# Patient Record
Sex: Female | Born: 2000 | Race: White | Hispanic: No | Marital: Single | Smoking: Never smoker
Health system: Southern US, Community
[De-identification: ages and names within clinical notes are randomized; demographics above are authoritative.]

## PROBLEM LIST (undated history)

## (undated) DIAGNOSIS — F32A Depression, unspecified: Secondary | ICD-10-CM

## (undated) DIAGNOSIS — F329 Major depressive disorder, single episode, unspecified: Secondary | ICD-10-CM

## (undated) DIAGNOSIS — J45909 Unspecified asthma, uncomplicated: Secondary | ICD-10-CM

## (undated) DIAGNOSIS — Q332 Sequestration of lung: Secondary | ICD-10-CM

## (undated) DIAGNOSIS — Q330 Congenital cystic lung: Secondary | ICD-10-CM

## (undated) DIAGNOSIS — F419 Anxiety disorder, unspecified: Secondary | ICD-10-CM

## (undated) DIAGNOSIS — R519 Headache, unspecified: Secondary | ICD-10-CM

## (undated) DIAGNOSIS — R0600 Dyspnea, unspecified: Secondary | ICD-10-CM

---
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
## 2020-04-23 ENCOUNTER — Emergency Department (HOSPITAL_COMMUNITY)
Admission: EM | Admit: 2020-04-23 | Discharge: 2020-04-23 | Disposition: A | Discharge status: Home / Self Care | Payer: Medicaid - Out of State | Attending: Emergency Medicine | Admitting: Emergency Medicine

## 2020-04-23 ENCOUNTER — Encounter (HOSPITAL_COMMUNITY): Payer: Self-pay

## 2020-04-23 ENCOUNTER — Other Ambulatory Visit: Payer: Self-pay

## 2020-04-23 DIAGNOSIS — Y92410 Unspecified street and highway as the place of occurrence of the external cause: Secondary | ICD-10-CM | POA: Insufficient documentation

## 2020-04-23 DIAGNOSIS — M25561 Pain in right knee: Secondary | ICD-10-CM | POA: Insufficient documentation

## 2020-04-23 DIAGNOSIS — J45909 Unspecified asthma, uncomplicated: Secondary | ICD-10-CM | POA: Diagnosis not present

## 2020-04-23 DIAGNOSIS — S0990XA Unspecified injury of head, initial encounter: Secondary | ICD-10-CM

## 2020-04-23 DIAGNOSIS — M25512 Pain in left shoulder: Secondary | ICD-10-CM | POA: Insufficient documentation

## 2020-04-23 DIAGNOSIS — S0083XA Contusion of other part of head, initial encounter: Secondary | ICD-10-CM | POA: Diagnosis not present

## 2020-04-23 DIAGNOSIS — M7918 Myalgia, other site: Secondary | ICD-10-CM

## 2020-04-23 DIAGNOSIS — M545 Low back pain, unspecified: Secondary | ICD-10-CM | POA: Insufficient documentation

## 2020-04-23 MED ORDER — METHOCARBAMOL 500 MG PO TABS
1000.0000 mg | ORAL_TABLET | Freq: Four times a day (QID) | ORAL | 0 refills | Status: AC
Start: 2020-04-23 — End: ?

## 2020-04-23 MED ORDER — METHOCARBAMOL 500 MG PO TABS
1000.0000 mg | ORAL_TABLET | Freq: Four times a day (QID) | ORAL | 0 refills | Status: DC
Start: 2020-04-23 — End: 2020-04-23
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
## 2020-04-23 NOTE — ED Notes (Signed)
Patient dc paperwork reviewed with the patient. The patient verbalized understanding of instructions. Patient discharged.
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
## 2020-06-30 ENCOUNTER — Emergency Department (HOSPITAL_COMMUNITY): Payer: Medicaid - Out of State

## 2020-06-30 ENCOUNTER — Other Ambulatory Visit: Payer: Self-pay

## 2020-06-30 ENCOUNTER — Encounter (HOSPITAL_COMMUNITY): Payer: Self-pay | Admitting: Emergency Medicine

## 2020-06-30 ENCOUNTER — Emergency Department (HOSPITAL_COMMUNITY)
Admission: EM | Admit: 2020-06-30 | Discharge: 2020-06-30 | Disposition: A | Discharge status: Home / Self Care | Payer: Medicaid - Out of State | Attending: Emergency Medicine | Admitting: Emergency Medicine

## 2020-06-30 DIAGNOSIS — J45909 Unspecified asthma, uncomplicated: Secondary | ICD-10-CM | POA: Insufficient documentation

## 2020-06-30 DIAGNOSIS — G43909 Migraine, unspecified, not intractable, without status migrainosus: Secondary | ICD-10-CM | POA: Diagnosis not present

## 2020-06-30 DIAGNOSIS — E236 Other disorders of pituitary gland: Secondary | ICD-10-CM

## 2020-06-30 DIAGNOSIS — H538 Other visual disturbances: Secondary | ICD-10-CM | POA: Diagnosis present

## 2020-06-30 LAB — PREGNANCY, URINE: Preg Test, Ur: NEGATIVE

## 2020-06-30 LAB — D-DIMER, QUANTITATIVE: D-Dimer, Quant: 0.31 ug/mL-FEU (ref 0.00–0.50)

## 2020-06-30 MED ORDER — OXYCODONE HCL 5 MG PO TABS: 2.5000 mg | ORAL_TABLET | ORAL | 0 refills | Status: AC | PRN

## 2020-06-30 MED ORDER — DIPHENHYDRAMINE HCL 50 MG/ML IJ SOLN
25.0000 mg | Freq: Once | INTRAMUSCULAR | Status: AC
  Administered 2020-06-30: 25 mg via INTRAVENOUS
  Filled 2020-06-30: qty 1

## 2020-06-30 MED ORDER — METOCLOPRAMIDE HCL 5 MG/ML IJ SOLN
10.0000 mg | Freq: Once | INTRAMUSCULAR | Status: AC
  Administered 2020-06-30: 10 mg via INTRAVENOUS
  Filled 2020-06-30: qty 2

## 2020-06-30 MED ORDER — SODIUM CHLORIDE 0.9 % IV BOLUS
500.0000 mL | Freq: Once | INTRAVENOUS | Status: AC
  Administered 2020-06-30: 500 mL via INTRAVENOUS

## 2020-06-30 NOTE — ED Notes (Addendum)
DC instructions reviewed with the pt, pt verbalized understanding.  PT DC.

## 2020-06-30 NOTE — Discharge Instructions (Signed)
Contact a health care provider if: You have sudden, unusual thirst. You are urinating more often than usual. You have a headache that does not go away. You develop new changes in your vision. You have clear fluid leaking from your nose or ears. You have a sensation of fluid trickling down the back of your throat. You have a salty taste in your mouth. You have trouble concentrating. Get help right away if: Your symptoms suddenly become severe. You have a nosebleed that does not stop after a few minutes. You have a fever of over 101F (38.3C). You have a severe headache. You have a stiff neck. You are confused or not as alert as usual. You have chest pain. You have shortness of breath.

## 2020-06-30 NOTE — ED Provider Notes (Signed)
[facility] EMERGENCY DEPARTMENT Provider Note   CSN: [id number] Arrival date & time: 06/30/20  0846     History No chief complaint on file.   Jennifer Daniel is a 20 y.o. female with a past medical history of anxiety, pituitary adenoma status post craniotomy presenting to the ED with a chief complaint of headache and blurry vision.  Patient states that she has had intermittent headaches since her craniotomy approximately 1 year ago.  She underwent an MRI approximately 1 week after her surgery and her neurosurgeon told her that "there was an abnormal speck there, but he does not know if it is just scar tissue.  He told me to follow-up in 1 year for another CT."  She noticed the past month she has had intermittent blurry vision that has worsened on the left side over the past week.  Reports left-sided headache as well that is only temporarily relieved with ibuprofen.  Last dose was approximately 6 hours ago.  She does have a history of migraines as well but is unable to differentiate whether this is similar to a migraine or the symptoms she experienced prior to her surgery.  She denies any numbness in arms or legs, changes to gait, vomiting.  Reports some nausea a few days ago and lightheadedness at work.  Denies any neck stiffness or fever.  Denies possibility of pregnancy.  No chest pain, shortness of breath, cough, sinus pain and pressure. She is in the process of moving and is unsure if this could be caused by stress.  HPI     Past Medical History:  Diagnosis Date  . Anxiety   . Asthma    physically induced asthma  . Congenital cystic adenomatoid malformation (CCAM)   . Depression   . Dyspnea    when over exerting self  . Headache   . Pituitary tumor 04/2019  . Pulmonary sequestration    cyst    Patient Active Problem List   Diagnosis Date Noted  . Pituitary adenoma ([place]) 06/23/2019    Past Surgical History:  Procedure Laterality Date  . CRANIOTOMY N/A  06/23/2019   Procedure: CRANIOTOMY HYPOPHYSECTOMY TRANSNASAL APPROACH;  Surgeon: Judith Part, MD;  Location: [facility];  Service: Neurosurgery;  Laterality: N/A;  . ENDOSCOPIC TRANS NASAL APPROACH WITH FUSION N/A 06/23/2019   Procedure: ENDOSCOPIC TRANS NASAL APPROACH WITH FUSION;  Surgeon: Jerrell Belfast, MD;  Location: [facility];  Service: ENT;  Laterality: N/A;  . LOBECTOMY Left 2002   lower left lobe , due to a cyst @ [facility]  . TRANSPHENOIDAL APPROACH EXPOSURE N/A 06/23/2019   Procedure: TRANSPHENOIDAL APPROACH EXPOSURE;  Surgeon: Jerrell Belfast, MD;  Location: [facility];  Service: ENT;  Laterality: N/A;     OB History   No obstetric history on file.     No family history on file.  Social History   Tobacco Use  . Smoking status: Never Smoker  . Smokeless tobacco: Never Used  . Tobacco comment: smok  Substance Use Topics  . Alcohol use: Never  . Drug use: Never    Home Medications Prior to Admission medications   Medication Sig Start Date End Date Taking? Authorizing Provider  methocarbamol (ROBAXIN) 500 MG tablet Take 2 tablets (1,000 mg total) by mouth 4 (four) times daily. 04/23/20   Carlisle Cater, PA-C    Allergies    Patient has no known allergies.  Review of Systems   Review of Systems  Constitutional: Negative for appetite change, chills and  fever.  HENT: Negative for ear pain, rhinorrhea, sneezing and sore throat.   Eyes: Negative for photophobia and visual disturbance.  Respiratory: Negative for cough, chest tightness, shortness of breath and wheezing.   Cardiovascular: Negative for chest pain and palpitations.  Gastrointestinal: Negative for abdominal pain, blood in stool, constipation, diarrhea, nausea and vomiting.  Genitourinary: Negative for dysuria, hematuria and urgency.  Musculoskeletal: Negative for myalgias.  Skin: Negative for rash.  Neurological: Positive for headaches. Negative for dizziness, weakness and light-headedness.    Physical  Exam Updated Vital Signs BP 126/75   Pulse 62   Temp 98.1 F (36.7 C) (Oral)   Resp 14   Ht 5\' 10"  (1.778 m)   Wt 108.9 kg   SpO2 100%   BMI 34.44 kg/m   Physical Exam Vitals and nursing note reviewed.  Constitutional:      General: She is not in acute distress.    Appearance: She is well-developed.  HENT:     Head: Normocephalic and atraumatic.     Nose: Nose normal.  Eyes:     General: No scleral icterus.       Right eye: No discharge.        Left eye: No discharge.     Conjunctiva/sclera: Conjunctivae normal.     Pupils: Pupils are equal, round, and reactive to light.  Neck:     Comments: No meningeal signs. Cardiovascular:     Rate and Rhythm: Normal rate and regular rhythm.     Heart sounds: Normal heart sounds. No murmur heard. No friction rub. No gallop.   Pulmonary:     Effort: Pulmonary effort is normal. No respiratory distress.     Breath sounds: Normal breath sounds.  Abdominal:     General: Bowel sounds are normal. There is no distension.     Palpations: Abdomen is soft.     Tenderness: There is no abdominal tenderness. There is no guarding.  Musculoskeletal:        General: Normal range of motion.     Cervical back: Normal range of motion and neck supple.  Skin:    General: Skin is warm and dry.     Findings: No rash.  Neurological:     Mental Status: She is alert and oriented to person, place, and time. Mental status is at baseline.     Cranial Nerves: No cranial nerve deficit.     Sensory: No sensory deficit.     Motor: No weakness or abnormal muscle tone.     Coordination: Coordination normal.     Comments: Pupils reactive. No facial asymmetry noted. Cranial nerves appear grossly intact. Sensation intact to light touch on face, BUE and BLE. Strength 5/5 in BUE and BLE. Normal finger to nose coordination bilaterally.     ED Results / Procedures / Treatments   Labs (all labs ordered are listed, but only abnormal results are displayed) Labs  Reviewed  PREGNANCY, URINE    EKG None  Radiology No results found.  Procedures Procedures   Medications Ordered in ED Medications  metoCLOPramide (REGLAN) injection 10 mg (10 mg Intravenous Given 06/30/20 1358)  diphenhydrAMINE (BENADRYL) injection 25 mg (25 mg Intravenous Given 06/30/20 1358)  sodium chloride 0.9 % bolus 500 mL (500 mLs Intravenous New Bag/Given 06/30/20 1357)    ED Course  I have reviewed the triage vital signs and the nursing notes.  Pertinent labs & imaging results that were available during my care of the patient were reviewed by me and  considered in my medical decision making (see chart for details).    MDM Rules/Calculators/A&P                          20 year old female with a past medical history of pituitary adenoma status postsurgery 1 year ago presenting to the ED with a chief complaint of headache.  Reports history of migraines in the past even before she felt was found to have the adenoma.  She noticed over the past month that her bilateral blurry vision has worsened in the past week it has worsened on the left side.  Reports left-sided headache that has been present for the past week.  Reports nausea and lightheadedness a few days ago but none currently.  No vomiting.  No neck stiffness, numbness in arms or legs, loss of vision, chest pain, sinus pressure.  On exam patient without neurological deficits noted.  No numbness or weakness.  No abnormal coordination.  She remains ambulatory.  Vital signs within normal limits.  We will need to obtain repeat MRI to evaluate for tumor recurrence. In the meantime will attempt to treat with migraine cocktail.   Portions of this note were generated with [employer]. Dictation errors may occur despite best attempts at proofreading.  Final Clinical Impression(s) / ED Diagnoses Final diagnoses:  Migraine without status migrainosus, not intractable, unspecified migraine type    Rx / DC Orders ED  Discharge Orders    None       Delia Heady, PA-C 06/30/20 1447    Lucrezia Starch, MD 06/30/20 2059

## 2020-06-30 NOTE — ED Notes (Signed)
2 warm blankets provided  

## 2020-06-30 NOTE — Progress Notes (Signed)
Patient ID: Jennifer Daniel, female   DOB: 03/07/2000, 20 y.o.   MRN: [id number] Films reviewed, ok for outpatient follow up with Dr. Zada Finders.

## 2020-06-30 NOTE — ED Notes (Signed)
Visual Acuity performed  R eye: 20/32 L eye: 20/16

## 2020-06-30 NOTE — ED Triage Notes (Signed)
Pt here with c/o headache for 3 days , pt does have a hx of brain tumor , surgery may 2021

## 2020-06-30 NOTE — ED Notes (Signed)
Blurred vision in left eye

## 2020-06-30 NOTE — ED Notes (Signed)
Patient transported to MRI 

## 2020-06-30 NOTE — ED Provider Notes (Signed)
Patient taken in signout from [place].  This is 20 year old female with a history of pituitary adenoma she has had 1 month of blurry vision and 1 week of significant left-sided headache she does also have a history of migraines.  No obvious neurodeficits on exam.  Currently awaiting MRI.  MRI results concerning for recurrent Pituitary adenoma.  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 Case discussed with Dr. Christella Noa who reports no further work up or imaging necessary at this time. PDMP reviewed during this encounter. Oxycodone rx given to use for severe pain as headache is unrelenting and no otc meds working. Patient will follow up with Dr. Zada Finders for recurrent tumor. Discussed return precautions.   Margarita Mail, PA-C 07/01/20 5726    Quintella Reichert, MD 07/01/20 [phone]

## 2020-07-06 ENCOUNTER — Other Ambulatory Visit (HOSPITAL_COMMUNITY): Payer: Self-pay | Admitting: Neurological Surgery

## 2020-07-06 ENCOUNTER — Other Ambulatory Visit: Payer: Self-pay | Admitting: Neurological Surgery

## 2020-07-06 DIAGNOSIS — D352 Benign neoplasm of pituitary gland: Secondary | ICD-10-CM

## 2020-07-12 ENCOUNTER — Ambulatory Visit (HOSPITAL_COMMUNITY)
Admission: RE | Admit: 2020-07-12 | Discharge: 2020-07-12 | Disposition: A | Discharge status: Home / Self Care | Payer: BC Managed Care – PPO | Source: Ambulatory Visit | Attending: Neurological Surgery | Admitting: Neurological Surgery

## 2020-07-12 ENCOUNTER — Other Ambulatory Visit: Payer: Self-pay

## 2020-07-12 DIAGNOSIS — D352 Benign neoplasm of pituitary gland: Secondary | ICD-10-CM | POA: Diagnosis present

## 2020-07-12 MED ORDER — GADOBUTROL 1 MMOL/ML IV SOLN
7.0000 mL | Freq: Once | INTRAVENOUS | Status: AC | PRN
  Administered 2020-07-12: 7 mL via INTRAVENOUS

## 2022-09-10 IMAGING — MR MR HEAD W/O CM
7 of 11 series · 25 of 48 positions shown · non-contrast
Comparison: MR head without and with contrast 11/09/2019 and
05/10/2019

CLINICAL DATA: Benign neoplasm of the CNS. Pituitary tumor status
post resection.

EXAM:
MRI HEAD WITHOUT CONTRAST
TECHNIQUE: Multiplanar, multiecho pulse sequences of the brain and surrounding
structures were obtained without intravenous contrast.

[Series 2: DWI · axial · 3.0mm · 0.94mm/px · z∈[-97,+55]mm · 7 of 104 slices shown (1 of 2)]
[im 1/104]
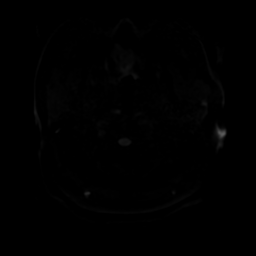
[im 18/104]
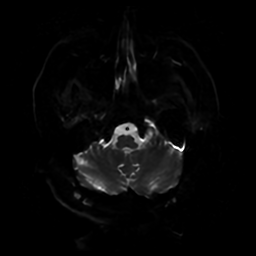
[im 35/104]
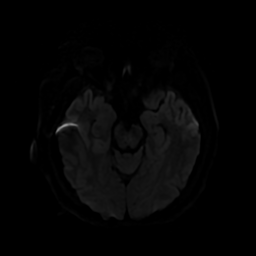
[im 52/104]
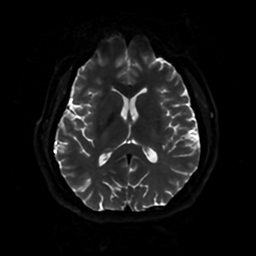
[im 69/104]
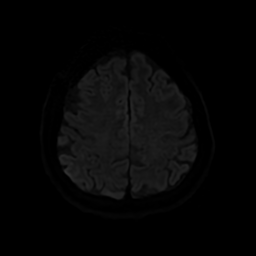
[im 86/104]
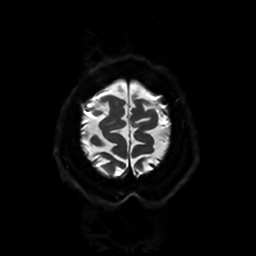
[im 104/104]
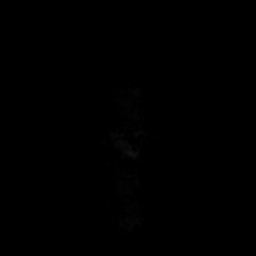

[Series 3: DWI · coronal · 4.0mm · 0.94mm/px · 6 of 73 slices shown (2 of 2)]
[im 1/73]
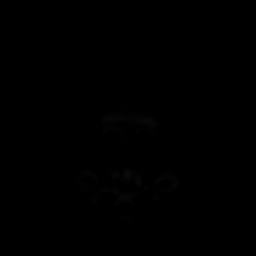
[im 15/73]
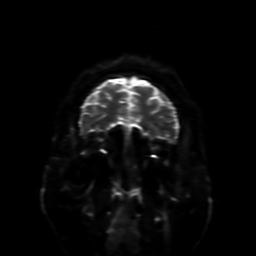
[im 29/73]
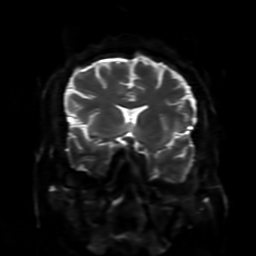
[im 44/73]
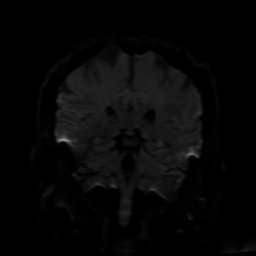
[im 58/73]
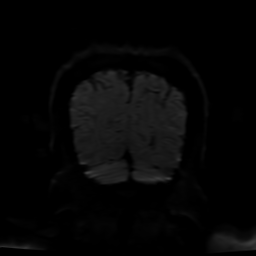
[im 73/73]
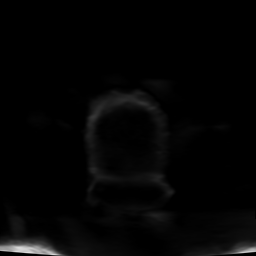

[Series 4: FLAIR · sagittal · 5.0mm · 0.23mm/px · 2 of 31 slices shown (1 of 2)]
[im 1/31]
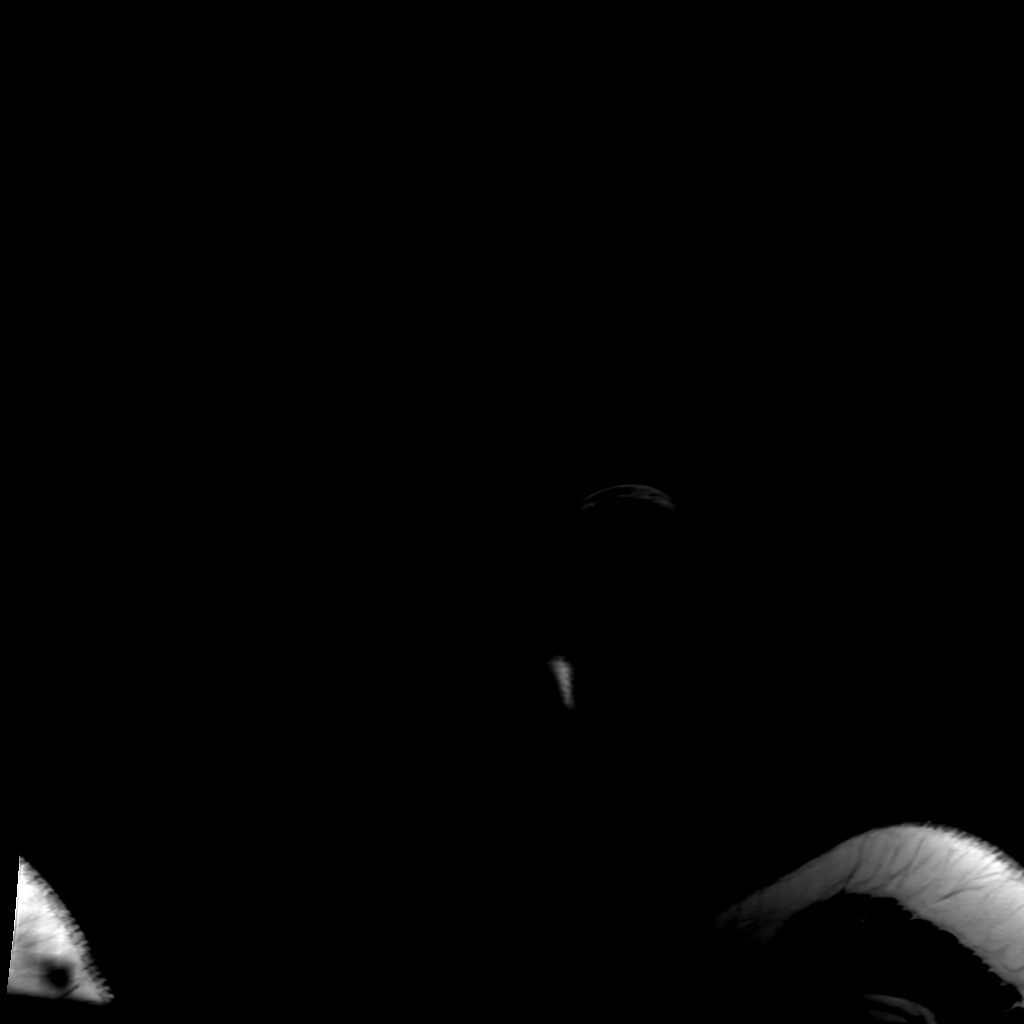
[im 31/31]
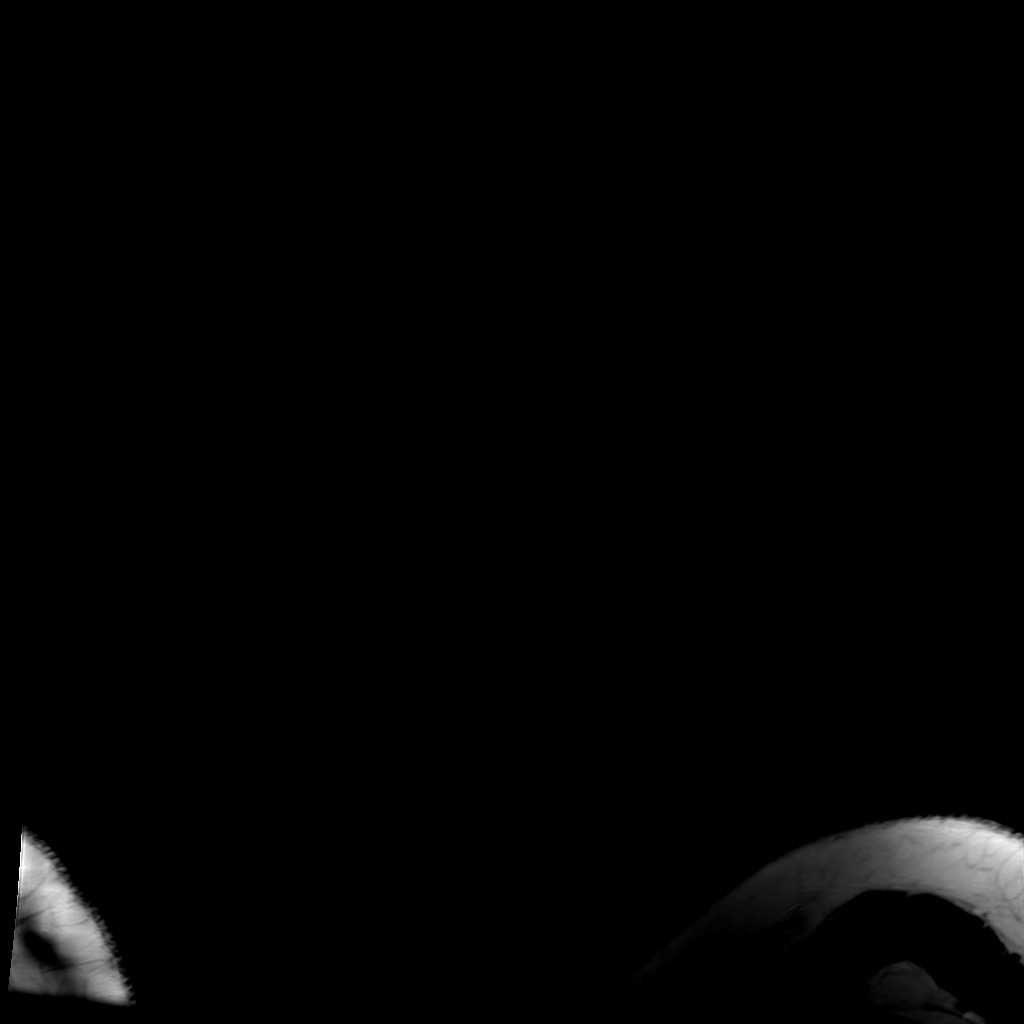

[Series 5: T2 · axial · 5.0mm · 0.23mm/px · 1 of 26 slices shown]
[im 1/26]
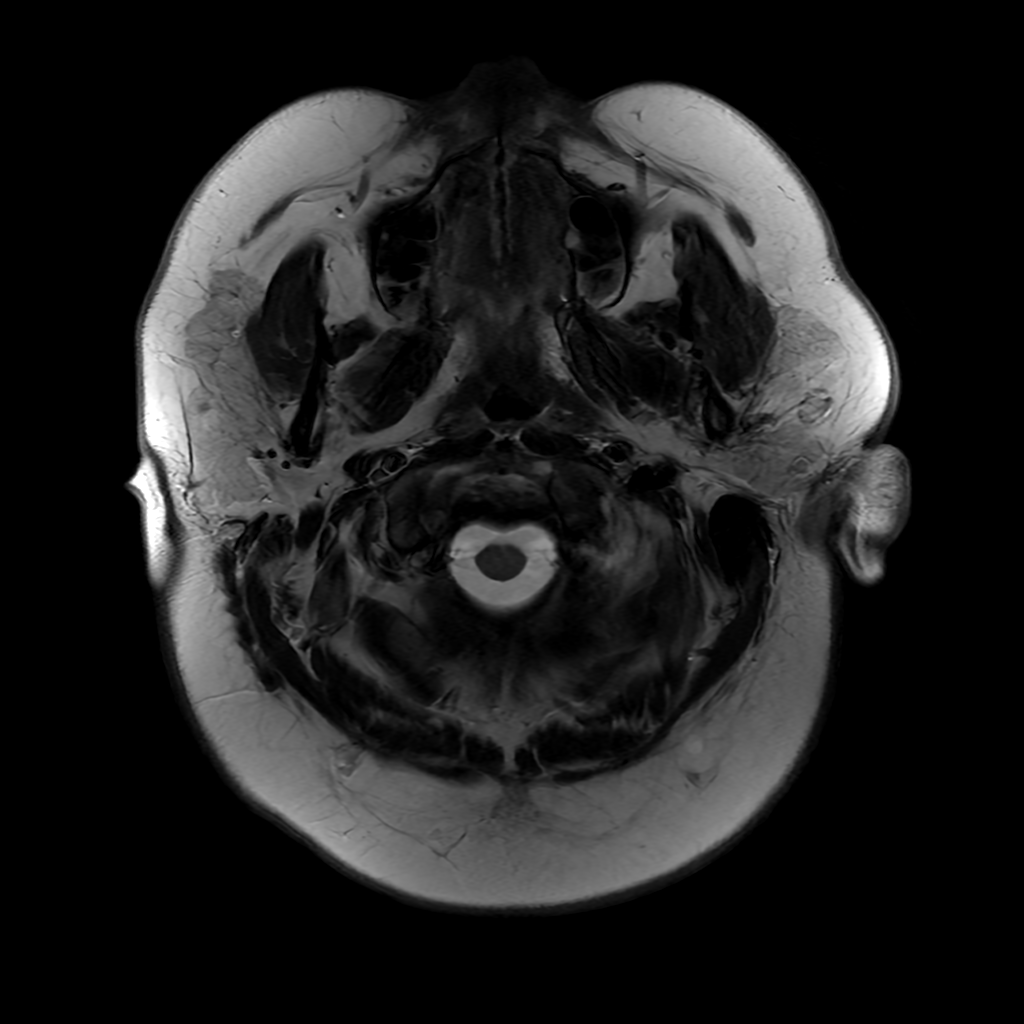

[Series 6: FLAIR · axial · 3.0mm · 0.45mm/px · z∈[-94,+55]mm · 2 of 26 slices shown (2 of 2)]
[im 1/26]
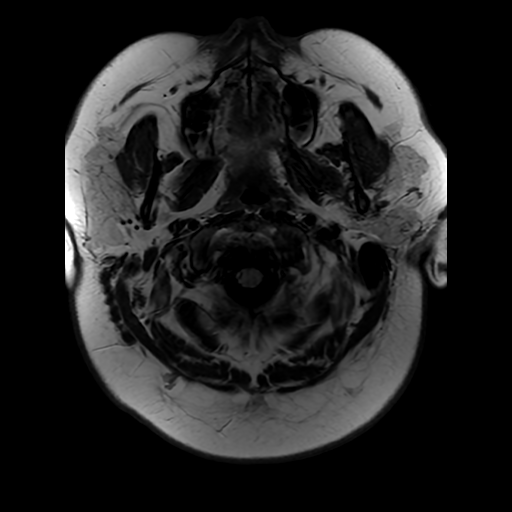
[im 26/26]
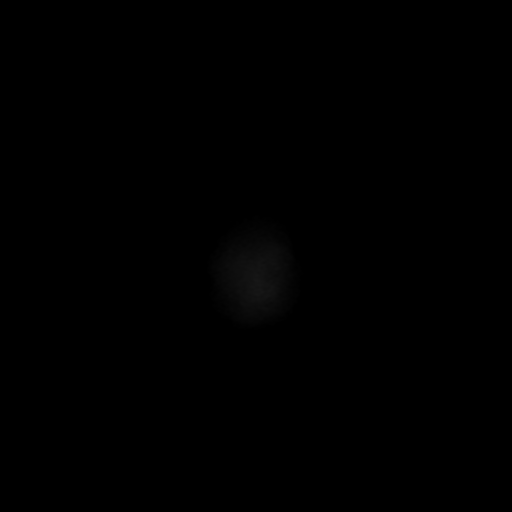

[Series 250: ADC · axial · 3.0mm · 0.94mm/px · z∈[-97,+55]mm · 4 of 52 slices shown (1 of 2)]
[im 1/52]
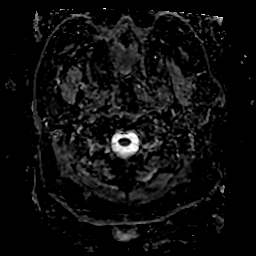
[im 18/52]
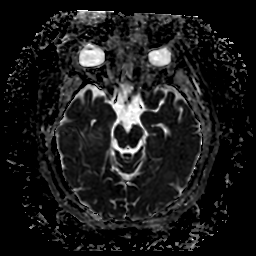
[im 35/52]
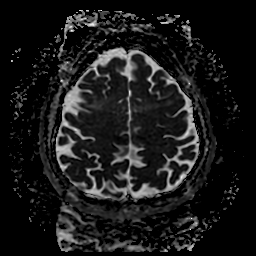
[im 52/52]
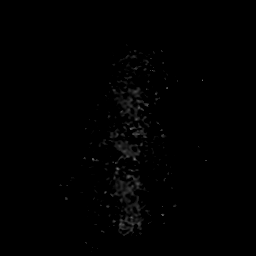

[Series 350: ADC · coronal · 4.0mm · 0.94mm/px · 3 of 37 slices shown (2 of 2)]
[im 1/37]
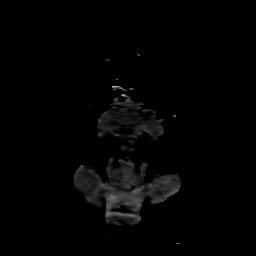
[im 19/37]
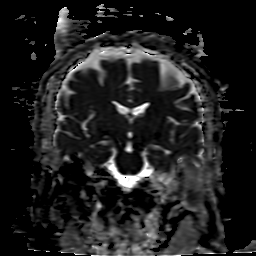
[im 37/37]
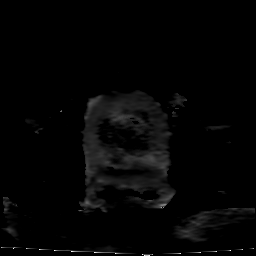

[25 of 48 positions shown; findings below may reference images not displayed]

FINDINGS: Brain: Suprasellar brain is unremarkable. No acute infarct,
hemorrhage, or mass lesion is present. The ventricles are of normal
size. No significant white matter lesions are present. No
significant extraaxial fluid collection is present. The paranasal
sinuses and mastoid air cells are clear.

Vascular: Flow is present in the major intracranial arteries.

Skull and upper cervical spine: The craniocervical junction is
normal. Upper cervical spine is within normal limits. Marrow signal
is unremarkable.

Sinuses/Orbits: Transsphenoidal surgery noted. Paranasal sinuses
otherwise clear. The globes and orbits are within normal limits.

Other: Although the study was not done as a pituitary protocol,
there is significant soft tissue in the left side of the sella,
concerning for tumor recurrence. This is noted on all images.
IMPRESSION: 1. Normal MRI appearance of the brain.
2. Although the study was not done as a pituitary protocol, there is
significant soft tissue in the left side of the sella concerning for
tumor recurrence. Recommend dedicated MRI of the brain and pituitary
with contrast for further evaluation.
